# Patient Record
Sex: Male | Born: 1995 | Race: White | Hispanic: No | Marital: Single | Smoking: Never smoker
Health system: Southern US, Community
[De-identification: ages and names within clinical notes are randomized; demographics above are authoritative.]

## PROBLEM LIST (undated history)

## (undated) HISTORY — PX: APPENDECTOMY: SHX54

---
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
## 2015-03-26 ENCOUNTER — Emergency Department
Admission: EM | Admit: 2015-03-26 | Discharge: 2015-03-26 | Disposition: A | Discharge status: Home / Self Care | Payer: BLUE CROSS/BLUE SHIELD | Attending: Emergency Medicine | Admitting: Emergency Medicine

## 2015-03-26 ENCOUNTER — Emergency Department: Payer: BLUE CROSS/BLUE SHIELD

## 2015-03-26 ENCOUNTER — Other Ambulatory Visit: Payer: Self-pay

## 2015-03-26 ENCOUNTER — Encounter: Payer: Self-pay | Admitting: Emergency Medicine

## 2015-03-26 DIAGNOSIS — R0602 Shortness of breath: Secondary | ICD-10-CM | POA: Insufficient documentation

## 2015-03-26 DIAGNOSIS — R0789 Other chest pain: Secondary | ICD-10-CM | POA: Insufficient documentation

## 2015-03-26 DIAGNOSIS — R079 Chest pain, unspecified: Secondary | ICD-10-CM | POA: Diagnosis present

## 2015-03-26 LAB — BASIC METABOLIC PANEL
Anion gap: 7 (ref 5–15)
BUN: 16 mg/dL (ref 6–20)
CALCIUM: 9.6 mg/dL (ref 8.9–10.3)
CO2: 31 mmol/L (ref 22–32)
CREATININE: 0.98 mg/dL (ref 0.61–1.24)
Chloride: 102 mmol/L (ref 101–111)
GFR calc Af Amer: >60 mL/min (ref >60)
GFR calc non Af Amer: >60 mL/min (ref >60)
Glucose, Bld: 104 mg/dL — ABNORMAL HIGH (ref 65–99)
Potassium: 4.1 mmol/L (ref 3.5–5.1)
Sodium: 140 mmol/L (ref 135–145)

## 2015-03-26 LAB — CBC
HEMATOCRIT: 40 % (ref 40.0–52.0)
HEMOGLOBIN: 13.3 g/dL (ref 13.0–18.0)
MCH: 26.8 pg (ref 26.0–34.0)
MCHC: 33.2 g/dL (ref 32.0–36.0)
MCV: 80.8 fL (ref 80.0–100.0)
Platelets: 223 10*3/uL (ref 150–440)
RBC: 4.95 MIL/uL (ref 4.40–5.90)
RDW: 13.4 % (ref 11.5–14.5)
WBC: 5.3 10*3/uL (ref 3.8–10.6)

## 2015-03-26 LAB — TROPONIN I: Troponin I: <0.03 ng/mL (ref <0.031)

## 2015-03-26 MED ORDER — IBUPROFEN 800 MG PO TABS: 800.0000 mg | ORAL_TABLET | Freq: Three times a day (TID) | ORAL | Status: AC | PRN

## 2015-03-26 NOTE — Discharge Instructions (Signed)

## 2015-03-26 NOTE — ED Provider Notes (Signed)
[facility] Emergency Department Provider Note     Time seen: ----------------------------------------- 9:36 PM on 03/26/2015 -----------------------------------------    I have reviewed the triage vital signs and the nursing notes.   HISTORY  Chief Complaint Chest Pain and Shortness of Breath    HPI Dean Mitchell is a 19 y.o. male who presents ER for left sided chest pain with slight difficulty breathing. Patient states it occurred while he was mowing his lawn on a riding lawnmower. Patient states he has not done anything unusual in terms of physical activity, leaning forward or taking a deep breath seemed to make his pain worse earlier. Pain is currently mild.    History reviewed. No pertinent past medical history.  There are no active problems to display for this patient.   Past Surgical History  Procedure Laterality Date  . Appendectomy      No current outpatient prescriptions on file.  Allergies Review of patient's allergies indicates no known allergies.  No family history on file.  Social History History  Substance Use Topics  . Smoking status: Never Smoker   . Smokeless tobacco: Never Used  . Alcohol Use: No    Review of Systems Constitutional: Negative for fever. Eyes: Negative for visual changes. ENT: Negative for sore throat. Cardiovascular: Positive for chest pain Respiratory: Positive for difficulty breathing Gastrointestinal: Negative for abdominal pain, vomiting and diarrhea. Genitourinary: Negative for dysuria. Musculoskeletal: Negative for back pain. Skin: Negative for rash. Neurological: Negative for headaches, focal weakness or numbness.  10-point ROS otherwise negative.  ____________________________________________   PHYSICAL EXAM:  VITAL SIGNS: ED Triage Vitals  Enc Vitals Group     BP 03/26/15 1956 131/70 mmHg     Pulse Rate 03/26/15 1956 57     Resp 03/26/15 1956 20     Temp 03/26/15 1956  98.1 F (36.7 C)     Temp Source 03/26/15 1956 Oral     SpO2 03/26/15 1956 100 %     Weight 03/26/15 1956 200 lb (90.719 kg)     Height 03/26/15 1956 6\' 4"  (1.93 m)     Head Cir --      Peak Flow --      Pain Score 03/26/15 1957 5     Pain Loc --      Pain Edu? --      Excl. in GC? --     Constitutional: Alert and oriented. Well appearing and in no distress. Eyes: Conjunctivae are normal. PERRL. Normal extraocular movements. ENT   Head: Normocephalic and atraumatic.   Nose: No congestion/rhinnorhea.   Mouth/Throat: Mucous membranes are moist.   Neck: No stridor. Hematological/Lymphatic/Immunilogical: No cervical lymphadenopathy. Cardiovascular: Normal rate, regular rhythm. Normal and symmetric distal pulses are present in all extremities. No murmurs, rubs, or gallops. Respiratory: Normal respiratory effort without tachypnea nor retractions. Breath sounds are clear and equal bilaterally. No wheezes/rales/rhonchi. Gastrointestinal: Soft and nontender. No distention. No abdominal bruits. There is no CVA tenderness. Musculoskeletal: Nontender with normal range of motion in all extremities. No joint effusions.  No lower extremity tenderness nor edema. Neurologic:  Normal speech and language. No gross focal neurologic deficits are appreciated. Speech is normal. No gait instability. Skin:  Skin is warm, dry and intact. No rash noted. Psychiatric: Mood and affect are normal. Speech and behavior are normal. Patient exhibits appropriate insight and judgment.  ____________________________________________    LABS (pertinent positives/negatives)  Labs Reviewed  BASIC METABOLIC PANEL - Abnormal; Notable for the following:  Glucose, Bld 104 (*)    All other components within normal limits  CBC  TROPONIN I   EKG: Sinus bradycardia with a rate of 59, sinus arrhythmia, PR intervals normal, QRS with no acute T interval is normal. No evidence of acute  infarction. ____________________________________________  ED COURSE:  Pertinent labs & imaging results that were available during my care of the patient were reviewed by me and considered in my medical decision making (see chart for details). I'll check basic labs and reevaluate.  ____________________________________________   RADIOLOGY  Chest x-ray PA and lateral FINDINGS: Cardiomediastinal silhouette is unremarkable. Strandy densities RIGHT middle lobe, LEFT lower lobe. The lungs are otherwise clear without pleural effusions or focal consolidations. Trachea projects midline and there is no pneumothorax. Soft tissue planes and included osseous structures are non-suspicious.  IMPRESSION: Strandy bibasilar densities favor atelectasis.  ____________________________________________    FINAL ASSESSMENT AND PLAN  Chest pain Plan: Patient is no acute distress, no clear etiology likely musculoskeletal. Stable for outpatient follow-up.   Emily FilbertWilliams, Jonathan E, MD   Emily FilbertJonathan E Williams, MD 03/26/15 2222

## 2015-03-26 NOTE — ED Notes (Signed)
Pt presents to ED with c/o left sided chest pain and "slight difficulty breathing". Pt states onset occurred while mowing his lawn. Denies similar symptoms previously. Pt currently has no increased work of breathing or acute distress noted. Pt calm during triage.

## 2016-04-29 IMAGING — CR DG CHEST 2V
1 series · 2 of 2 positions shown · non-contrast
Comparison: Chest radiograph October 12, 2006

CLINICAL DATA: LEFT chest pain, slight difficulty breathing while
lying long today.

EXAM:
CHEST  2 VIEW

[Series 1: dg chest 2 view · 0.14mm/px · 2 of 2 slices shown]
[im 1/2]
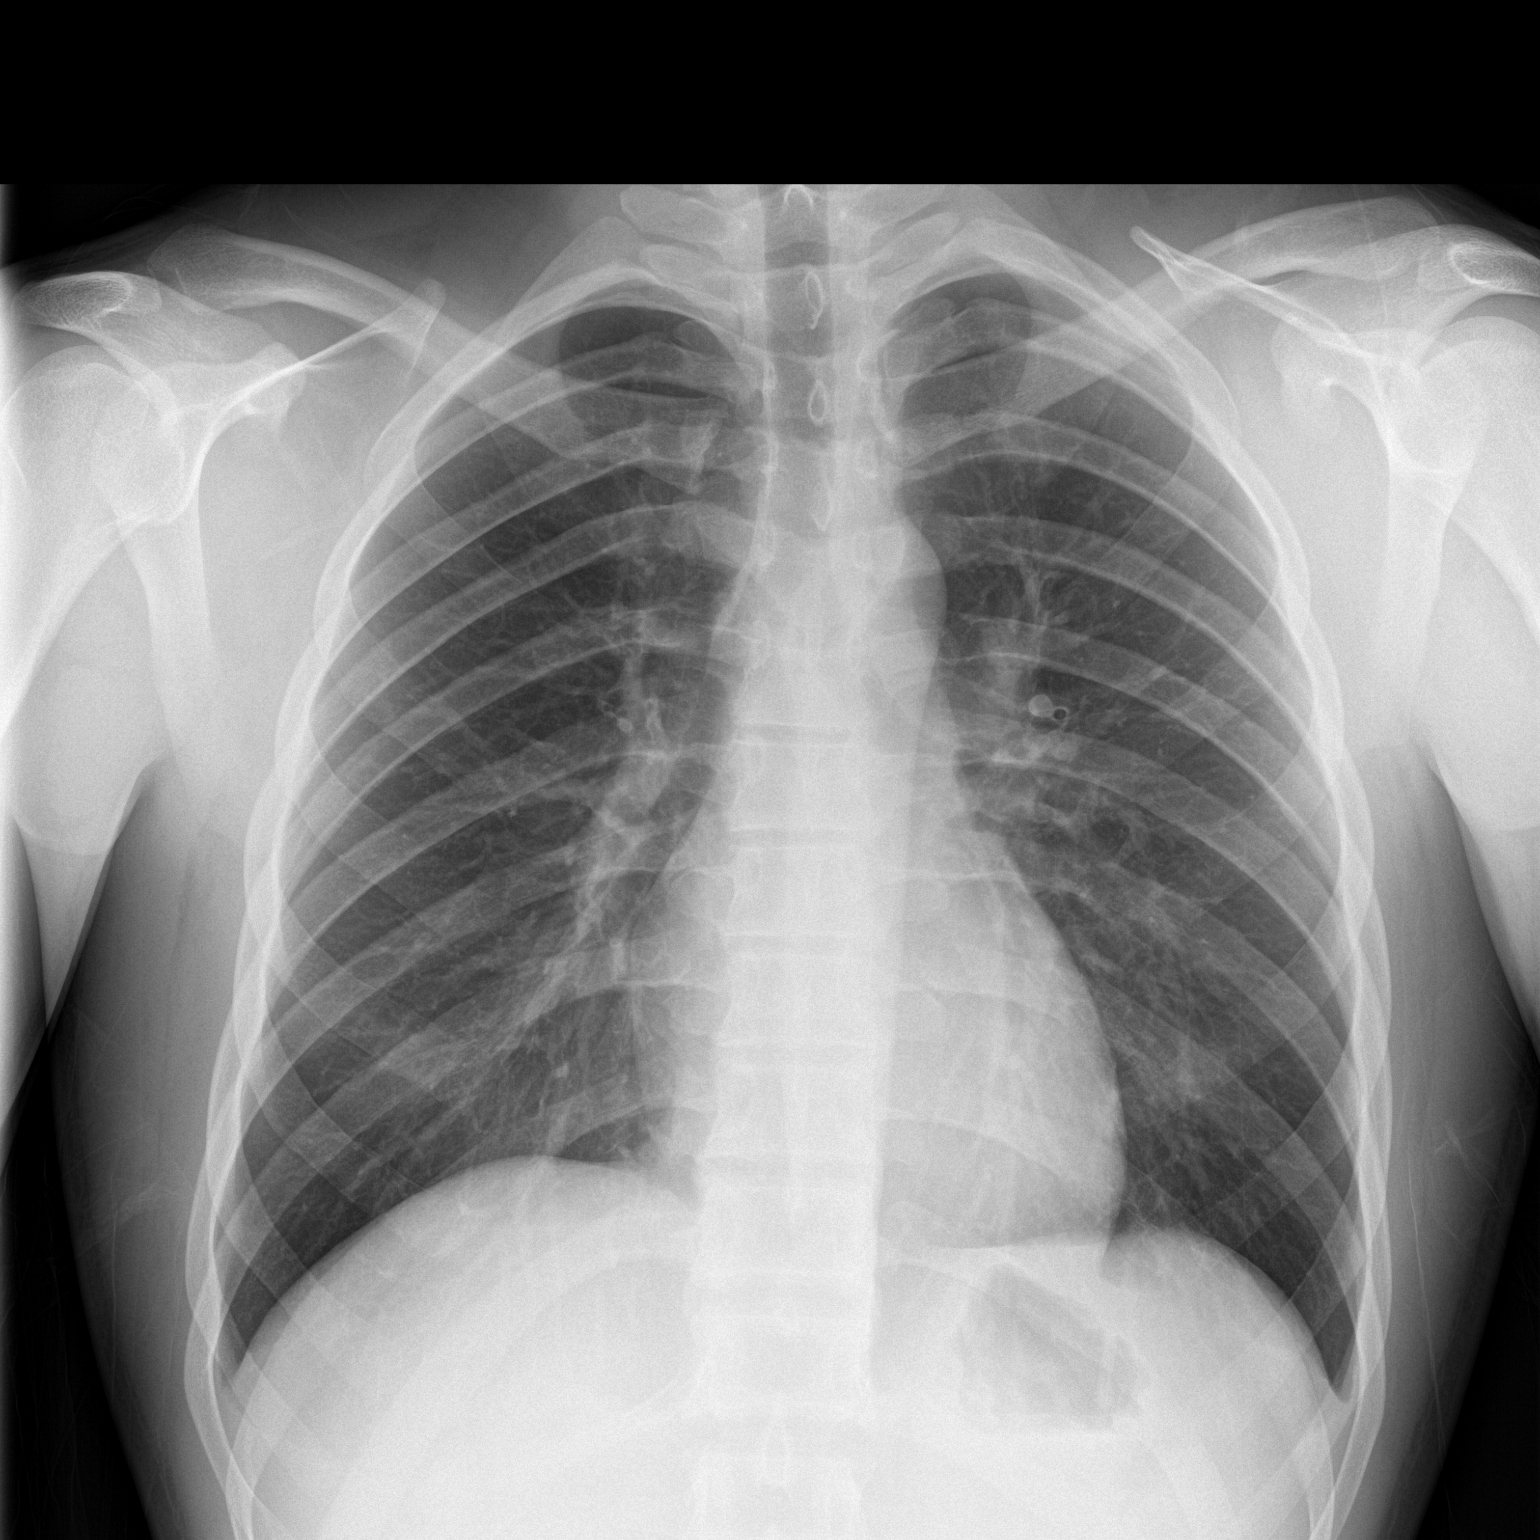
[im 2/2]
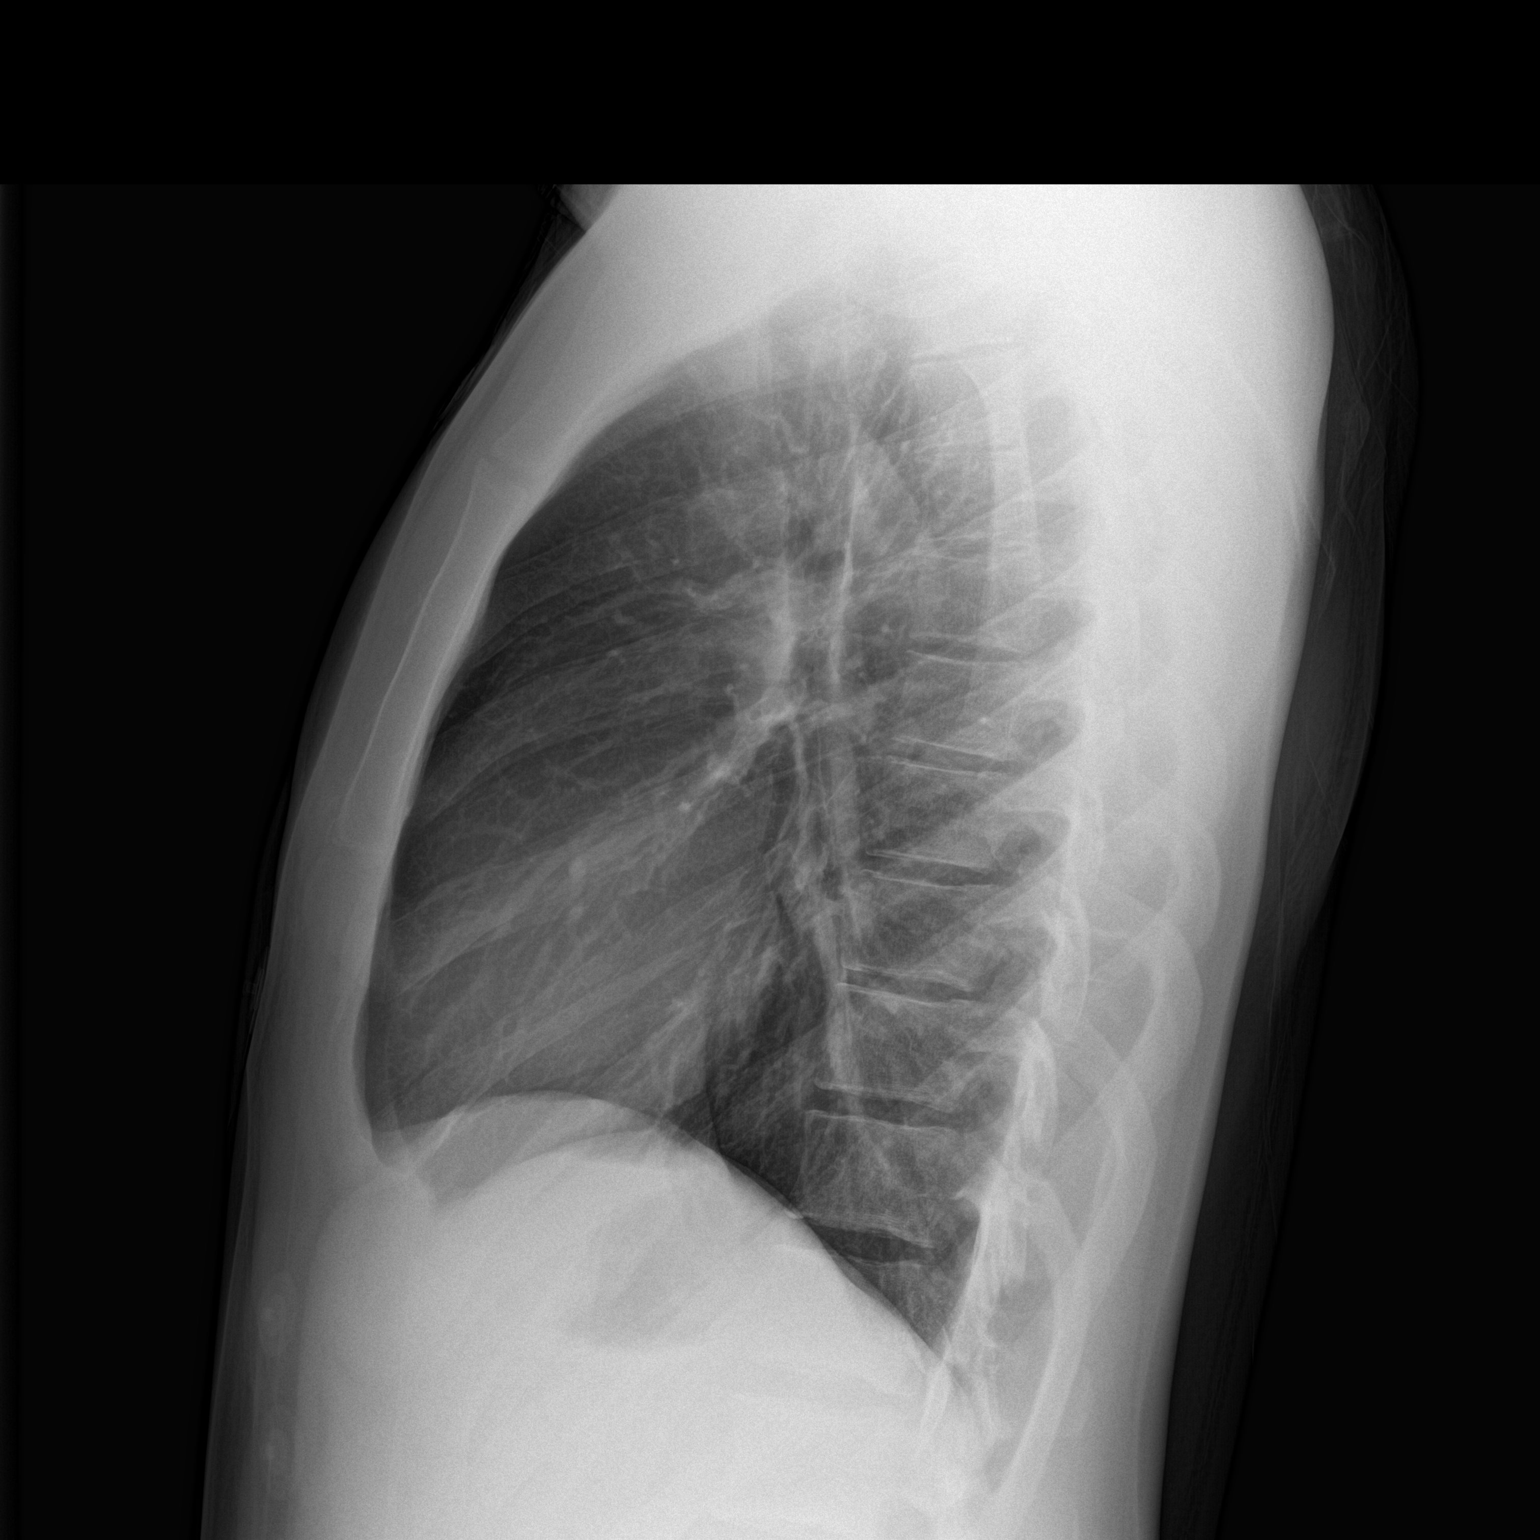

[2 of 2 positions shown; findings below may reference images not displayed]

FINDINGS: Cardiomediastinal silhouette is unremarkable. Strandy densities
RIGHT middle lobe, LEFT lower lobe. The lungs are otherwise clear
without pleural effusions or focal consolidations. Trachea projects
midline and there is no pneumothorax. Soft tissue planes and
included osseous structures are non-suspicious.
IMPRESSION: Strandy bibasilar densities favor atelectasis.

By: Liliam Gallant
